# Patient Record
Sex: Female | Born: 2000 | Race: Black or African American | Hispanic: No | Marital: Single | Smoking: Never smoker
Health system: Southern US, Community
[De-identification: ages and names within clinical notes are randomized; demographics above are authoritative.]

## PROBLEM LIST (undated history)

## (undated) DIAGNOSIS — Q632 Ectopic kidney: Secondary | ICD-10-CM

## (undated) DIAGNOSIS — J181 Lobar pneumonia, unspecified organism: Secondary | ICD-10-CM

## (undated) HISTORY — DX: Lobar pneumonia, unspecified organism: J18.1

## (undated) HISTORY — DX: Ectopic kidney: Q63.2

---
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
## 2020-09-30 ENCOUNTER — Encounter (HOSPITAL_COMMUNITY): Payer: Self-pay

## 2020-09-30 ENCOUNTER — Emergency Department (HOSPITAL_COMMUNITY)
Admission: EM | Admit: 2020-09-30 | Discharge: 2020-10-01 | Disposition: A | Discharge status: Home / Self Care | Payer: 59 | Attending: Emergency Medicine | Admitting: Emergency Medicine

## 2020-09-30 ENCOUNTER — Other Ambulatory Visit: Payer: Self-pay

## 2020-09-30 ENCOUNTER — Emergency Department (HOSPITAL_COMMUNITY): Payer: 59

## 2020-09-30 DIAGNOSIS — M25552 Pain in left hip: Secondary | ICD-10-CM | POA: Insufficient documentation

## 2020-09-30 DIAGNOSIS — M25551 Pain in right hip: Secondary | ICD-10-CM | POA: Diagnosis not present

## 2020-09-30 DIAGNOSIS — S39012A Strain of muscle, fascia and tendon of lower back, initial encounter: Secondary | ICD-10-CM | POA: Diagnosis not present

## 2020-09-30 DIAGNOSIS — S301XXA Contusion of abdominal wall, initial encounter: Secondary | ICD-10-CM | POA: Insufficient documentation

## 2020-09-30 DIAGNOSIS — Y92410 Unspecified street and highway as the place of occurrence of the external cause: Secondary | ICD-10-CM | POA: Insufficient documentation

## 2020-09-30 DIAGNOSIS — S299XXA Unspecified injury of thorax, initial encounter: Secondary | ICD-10-CM | POA: Diagnosis present

## 2020-09-30 DIAGNOSIS — S20219A Contusion of unspecified front wall of thorax, initial encounter: Secondary | ICD-10-CM | POA: Insufficient documentation

## 2020-09-30 LAB — CBC WITH DIFFERENTIAL/PLATELET
Abs Immature Granulocytes: 0.04 10*3/uL (ref 0.00–0.07)
Basophils Absolute: 0.1 10*3/uL (ref 0.0–0.1)
Basophils Relative: 1 %
Eosinophils Absolute: 0 10*3/uL (ref 0.0–0.5)
Eosinophils Relative: 0 %
HCT: 40.6 % (ref 36.0–46.0)
Hemoglobin: 12.6 g/dL (ref 12.0–15.0)
Immature Granulocytes: 1 %
Lymphocytes Relative: 24 %
Lymphs Abs: 1.2 10*3/uL (ref 0.7–4.0)
MCH: 28.2 pg (ref 26.0–34.0)
MCHC: 31 g/dL (ref 30.0–36.0)
MCV: 90.8 fL (ref 80.0–100.0)
Monocytes Absolute: 0.9 10*3/uL (ref 0.1–1.0)
Monocytes Relative: 18 %
Neutro Abs: 2.7 10*3/uL (ref 1.7–7.7)
Neutrophils Relative %: 56 %
Platelets: 237 10*3/uL (ref 150–400)
RBC: 4.47 MIL/uL (ref 3.87–5.11)
RDW: 13.3 % (ref 11.5–15.5)
WBC: 4.9 10*3/uL (ref 4.0–10.5)
nRBC: 0 % (ref 0.0–0.2)

## 2020-09-30 LAB — BASIC METABOLIC PANEL
Anion gap: 5 (ref 5–15)
BUN: <5 mg/dL — ABNORMAL LOW (ref 6–20)
CO2: 24 mmol/L (ref 22–32)
Calcium: 8.7 mg/dL — ABNORMAL LOW (ref 8.9–10.3)
Chloride: 112 mmol/L — ABNORMAL HIGH (ref 98–111)
Creatinine, Ser: 0.75 mg/dL (ref 0.44–1.00)
GFR, Estimated: >60 mL/min (ref >60)
Glucose, Bld: 96 mg/dL (ref 70–99)
Potassium: 3.4 mmol/L — ABNORMAL LOW (ref 3.5–5.1)
Sodium: 141 mmol/L (ref 135–145)

## 2020-09-30 LAB — I-STAT BETA HCG BLOOD, ED (MC, WL, AP ONLY): I-stat hCG, quantitative: <5 m[IU]/mL (ref <5)
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
## 2020-10-01 MED ORDER — CYCLOBENZAPRINE HCL 10 MG PO TABS
10.0000 mg | ORAL_TABLET | Freq: Once | ORAL | Status: AC
  Administered 2020-10-01: 10 mg via ORAL
  Filled 2020-10-01: qty 1

## 2020-10-01 MED ORDER — TRAMADOL HCL 50 MG PO TABS
50.0000 mg | ORAL_TABLET | Freq: Once | ORAL | Status: AC
  Administered 2020-10-01: 50 mg via ORAL
  Filled 2020-10-01: qty 1

## 2020-10-01 MED ORDER — TRAMADOL HCL 50 MG PO TABS: 50.0000 mg | ORAL_TABLET | Freq: Four times a day (QID) | ORAL | 0 refills | Status: AC | PRN

## 2020-10-01 MED ORDER — ACETAMINOPHEN 325 MG PO TABS
650.0000 mg | ORAL_TABLET | Freq: Once | ORAL | Status: AC
  Administered 2020-10-01: 650 mg via ORAL
  Filled 2020-10-01: qty 2

## 2020-10-01 MED ORDER — ORPHENADRINE CITRATE ER 100 MG PO TB12: 100.0000 mg | ORAL_TABLET | Freq: Two times a day (BID) | ORAL | 0 refills | Status: AC
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
## 2020-10-01 NOTE — Discharge Instructions (Signed)
Apply ice for 30 minutes at a time, 4 times a day.  Take acetaminophen as needed for pain.  If pain is not adequately controlled, you may take the tramadol which was prescribed.
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
